# Patient Record
Sex: Female | Born: 1990 | Race: Asian | Hispanic: No | Marital: Married | Smoking: Never smoker
Health system: Southern US, Community
[De-identification: ages and names within clinical notes are randomized; demographics above are authoritative.]

## PROBLEM LIST (undated history)

## (undated) DIAGNOSIS — Z789 Other specified health status: Secondary | ICD-10-CM

## (undated) HISTORY — PX: NO PAST SURGERIES: SHX2092

---

## 2013-11-20 NOTE — L&D Delivery Note (Signed)
Delivery Note At 9:16 AM a viable female was delivered via Vaginal, Spontaneous Delivery (Presentation: Left Occiput Anterior). Loose nuchal cord reduced over body. Trailing membranes. Uterine exploration x1 for small fragment. Hemostatic.  APGAR: 8, 9; weight .   Placenta status: Intact, Spontaneous.  Cord: 3 vessels with the following complications: None.    Anesthesia: Epidural  Episiotomy: None Lacerations: 2nd degree Suture Repair: 3.0 vicryl Est. Blood Loss (mL): 300  Mom to postpartum.  Baby to Couplet care / Skin to Skin.  Bena Kobel 05/31/2014, 10:02 AM

## 2013-12-01 ENCOUNTER — Other Ambulatory Visit (HOSPITAL_COMMUNITY): Payer: Self-pay | Admitting: Physician Assistant

## 2013-12-01 DIAGNOSIS — Z3689 Encounter for other specified antenatal screening: Secondary | ICD-10-CM

## 2013-12-01 LAB — OB RESULTS CONSOLE HIV ANTIBODY (ROUTINE TESTING): HIV: NONREACTIVE

## 2013-12-01 LAB — OB RESULTS CONSOLE GC/CHLAMYDIA
Chlamydia: NEGATIVE
Gonorrhea: NEGATIVE

## 2013-12-01 LAB — OB RESULTS CONSOLE RPR: RPR: NONREACTIVE

## 2013-12-01 LAB — OB RESULTS CONSOLE ABO/RH: RH Type: POSITIVE

## 2013-12-01 LAB — OB RESULTS CONSOLE RUBELLA ANTIBODY, IGM: Rubella: IMMUNE

## 2013-12-01 LAB — OB RESULTS CONSOLE HEPATITIS B SURFACE ANTIGEN: Hepatitis B Surface Ag: NEGATIVE

## 2013-12-01 LAB — OB RESULTS CONSOLE ANTIBODY SCREEN: Antibody Screen: NEGATIVE

## 2013-12-02 ENCOUNTER — Other Ambulatory Visit (HOSPITAL_COMMUNITY): Payer: Self-pay | Admitting: Physician Assistant

## 2013-12-02 DIAGNOSIS — Z3689 Encounter for other specified antenatal screening: Secondary | ICD-10-CM

## 2013-12-05 ENCOUNTER — Ambulatory Visit (HOSPITAL_COMMUNITY)
Admission: RE | Admit: 2013-12-05 | Discharge: 2013-12-05 | Disposition: A | Discharge status: Home / Self Care | Payer: Medicaid Other | Source: Ambulatory Visit | Attending: Physician Assistant | Admitting: Physician Assistant

## 2013-12-05 DIAGNOSIS — O26879 Cervical shortening, unspecified trimester: Secondary | ICD-10-CM | POA: Insufficient documentation

## 2013-12-05 DIAGNOSIS — Z3689 Encounter for other specified antenatal screening: Secondary | ICD-10-CM

## 2014-01-05 ENCOUNTER — Ambulatory Visit (HOSPITAL_COMMUNITY)
Admission: RE | Admit: 2014-01-05 | Discharge: 2014-01-05 | Disposition: A | Discharge status: Home / Self Care | Payer: Medicaid Other | Source: Ambulatory Visit | Attending: Physician Assistant | Admitting: Physician Assistant

## 2014-01-05 DIAGNOSIS — Z3689 Encounter for other specified antenatal screening: Secondary | ICD-10-CM | POA: Insufficient documentation

## 2014-01-06 ENCOUNTER — Other Ambulatory Visit (HOSPITAL_COMMUNITY): Payer: Self-pay | Admitting: Physician Assistant

## 2014-01-06 DIAGNOSIS — N883 Incompetence of cervix uteri: Secondary | ICD-10-CM

## 2014-01-19 ENCOUNTER — Ambulatory Visit (HOSPITAL_COMMUNITY)
Admission: RE | Admit: 2014-01-19 | Discharge: 2014-01-19 | Disposition: A | Discharge status: Home / Self Care | Payer: Medicaid Other | Source: Ambulatory Visit | Attending: Physician Assistant | Admitting: Physician Assistant

## 2014-01-19 DIAGNOSIS — N883 Incompetence of cervix uteri: Secondary | ICD-10-CM

## 2014-01-19 DIAGNOSIS — O26879 Cervical shortening, unspecified trimester: Secondary | ICD-10-CM | POA: Insufficient documentation

## 2014-04-14 ENCOUNTER — Other Ambulatory Visit (HOSPITAL_COMMUNITY): Payer: Self-pay | Admitting: Nurse Practitioner

## 2014-04-14 DIAGNOSIS — Z0374 Encounter for suspected problem with fetal growth ruled out: Secondary | ICD-10-CM

## 2014-04-23 ENCOUNTER — Ambulatory Visit (HOSPITAL_COMMUNITY)
Admission: RE | Admit: 2014-04-23 | Discharge: 2014-04-23 | Disposition: A | Discharge status: Home / Self Care | Payer: Medicaid Other | Source: Ambulatory Visit | Attending: Nurse Practitioner | Admitting: Nurse Practitioner

## 2014-04-23 DIAGNOSIS — Z0374 Encounter for suspected problem with fetal growth ruled out: Secondary | ICD-10-CM

## 2014-04-23 DIAGNOSIS — Z3689 Encounter for other specified antenatal screening: Secondary | ICD-10-CM | POA: Insufficient documentation

## 2014-05-04 LAB — OB RESULTS CONSOLE GBS: GBS: NEGATIVE

## 2014-05-31 ENCOUNTER — Inpatient Hospital Stay (HOSPITAL_COMMUNITY)
Admission: AD | Admit: 2014-05-31 | Discharge: 2014-06-01 | DRG: 775 | Disposition: A | Discharge status: Home / Self Care | Payer: Medicaid Other | Source: Ambulatory Visit | Attending: Obstetrics & Gynecology | Admitting: Obstetrics & Gynecology

## 2014-05-31 ENCOUNTER — Inpatient Hospital Stay (HOSPITAL_COMMUNITY): Payer: Medicaid Other | Admitting: Anesthesiology

## 2014-05-31 ENCOUNTER — Encounter (HOSPITAL_COMMUNITY): Payer: Medicaid Other | Admitting: Anesthesiology

## 2014-05-31 ENCOUNTER — Encounter (HOSPITAL_COMMUNITY): Payer: Self-pay | Admitting: *Deleted

## 2014-05-31 DIAGNOSIS — O479 False labor, unspecified: Secondary | ICD-10-CM | POA: Diagnosis present

## 2014-05-31 DIAGNOSIS — IMO0001 Reserved for inherently not codable concepts without codable children: Secondary | ICD-10-CM

## 2014-05-31 HISTORY — DX: Other specified health status: Z78.9

## 2014-05-31 LAB — CBC
HCT: 34.5 % — ABNORMAL LOW (ref 36.0–46.0)
HEMOGLOBIN: 11.1 g/dL — AB (ref 12.0–15.0)
MCH: 23.3 pg — AB (ref 26.0–34.0)
MCHC: 32.2 g/dL (ref 30.0–36.0)
MCV: 72.3 fL — AB (ref 78.0–100.0)
PLATELETS: 322 10*3/uL (ref 150–400)
RBC: 4.77 MIL/uL (ref 3.87–5.11)
RDW: 14.3 % (ref 11.5–15.5)
WBC: 12.4 10*3/uL — ABNORMAL HIGH (ref 4.0–10.5)

## 2014-05-31 LAB — TYPE AND SCREEN
ABO/RH(D): A POS
ANTIBODY SCREEN: NEGATIVE

## 2014-05-31 LAB — ABO/RH: ABO/RH(D): A POS

## 2014-05-31 LAB — RPR

## 2014-05-31 MED ORDER — OXYCODONE-ACETAMINOPHEN 5-325 MG PO TABS
1.0000 | ORAL_TABLET | ORAL | Status: DC | PRN
  Administered 2014-06-01: 1 via ORAL
  Filled 2014-05-31: qty 1

## 2014-05-31 MED ORDER — LANOLIN HYDROUS EX OINT: TOPICAL_OINTMENT | CUTANEOUS | Status: DC | PRN

## 2014-05-31 MED ORDER — LACTATED RINGERS IV SOLN
500.0000 mL | INTRAVENOUS | Status: DC | PRN
  Administered 2014-05-31: 500 mL via INTRAVENOUS

## 2014-05-31 MED ORDER — WITCH HAZEL-GLYCERIN EX PADS: 1.0000 "application " | MEDICATED_PAD | CUTANEOUS | Status: DC | PRN

## 2014-05-31 MED ORDER — PHENYLEPHRINE 40 MCG/ML (10ML) SYRINGE FOR IV PUSH (FOR BLOOD PRESSURE SUPPORT)
80.0000 ug | PREFILLED_SYRINGE | INTRAVENOUS | Status: DC | PRN
  Filled 2014-05-31: qty 2
  Filled 2014-05-31: qty 10

## 2014-05-31 MED ORDER — ONDANSETRON HCL 4 MG/2ML IJ SOLN: 4.0000 mg | INTRAMUSCULAR | Status: DC | PRN

## 2014-05-31 MED ORDER — ONDANSETRON HCL 4 MG PO TABS: 4.0000 mg | ORAL_TABLET | ORAL | Status: DC | PRN

## 2014-05-31 MED ORDER — DIPHENHYDRAMINE HCL 25 MG PO CAPS: 25.0000 mg | ORAL_CAPSULE | Freq: Four times a day (QID) | ORAL | Status: DC | PRN

## 2014-05-31 MED ORDER — IBUPROFEN 600 MG PO TABS
600.0000 mg | ORAL_TABLET | Freq: Four times a day (QID) | ORAL | Status: DC
  Administered 2014-05-31 – 2014-06-01 (×3): 600 mg via ORAL
  Filled 2014-05-31 (×3): qty 1

## 2014-05-31 MED ORDER — LACTATED RINGERS IV SOLN
500.0000 mL | Freq: Once | INTRAVENOUS | Status: AC
  Administered 2014-05-31: 500 mL via INTRAVENOUS

## 2014-05-31 MED ORDER — SENNOSIDES-DOCUSATE SODIUM 8.6-50 MG PO TABS
2.0000 | ORAL_TABLET | ORAL | Status: DC
  Administered 2014-05-31: 2 via ORAL
  Filled 2014-05-31: qty 2

## 2014-05-31 MED ORDER — ONDANSETRON HCL 4 MG/2ML IJ SOLN: 4.0000 mg | Freq: Four times a day (QID) | INTRAMUSCULAR | Status: DC | PRN

## 2014-05-31 MED ORDER — TETANUS-DIPHTH-ACELL PERTUSSIS 5-2.5-18.5 LF-MCG/0.5 IM SUSP: 0.5000 mL | Freq: Once | INTRAMUSCULAR | Status: DC

## 2014-05-31 MED ORDER — FENTANYL 2.5 MCG/ML BUPIVACAINE 1/10 % EPIDURAL INFUSION (WH - ANES): 12.0000 mL/h | INTRAMUSCULAR | Status: DC | PRN

## 2014-05-31 MED ORDER — BENZOCAINE-MENTHOL 20-0.5 % EX AERO: 1.0000 "application " | INHALATION_SPRAY | CUTANEOUS | Status: DC | PRN

## 2014-05-31 MED ORDER — OXYTOCIN BOLUS FROM INFUSION
500.0000 mL | INTRAVENOUS | Status: DC
  Administered 2014-05-31: 500 mL via INTRAVENOUS

## 2014-05-31 MED ORDER — ACETAMINOPHEN 325 MG PO TABS: 650.0000 mg | ORAL_TABLET | ORAL | Status: DC | PRN

## 2014-05-31 MED ORDER — PRENATAL MULTIVITAMIN CH
1.0000 | ORAL_TABLET | Freq: Every day | ORAL | Status: DC
  Administered 2014-06-01: 1 via ORAL
  Filled 2014-05-31: qty 1

## 2014-05-31 MED ORDER — DIBUCAINE 1 % RE OINT: 1.0000 "application " | TOPICAL_OINTMENT | RECTAL | Status: DC | PRN

## 2014-05-31 MED ORDER — SIMETHICONE 80 MG PO CHEW: 80.0000 mg | CHEWABLE_TABLET | ORAL | Status: DC | PRN

## 2014-05-31 MED ORDER — LIDOCAINE HCL (PF) 1 % IJ SOLN
30.0000 mL | INTRAMUSCULAR | Status: DC | PRN
  Filled 2014-05-31: qty 30

## 2014-05-31 MED ORDER — ZOLPIDEM TARTRATE 5 MG PO TABS: 5.0000 mg | ORAL_TABLET | Freq: Every evening | ORAL | Status: DC | PRN

## 2014-05-31 MED ORDER — FENTANYL 2.5 MCG/ML BUPIVACAINE 1/10 % EPIDURAL INFUSION (WH - ANES)
INTRAMUSCULAR | Status: DC | PRN
  Administered 2014-05-31: 12 mL/h via EPIDURAL

## 2014-05-31 MED ORDER — EPHEDRINE 5 MG/ML INJ
10.0000 mg | INTRAVENOUS | Status: DC | PRN
  Filled 2014-05-31: qty 2

## 2014-05-31 MED ORDER — OXYTOCIN 40 UNITS IN LACTATED RINGERS INFUSION - SIMPLE MED
62.5000 mL/h | INTRAVENOUS | Status: DC
  Filled 2014-05-31: qty 1000

## 2014-05-31 MED ORDER — IBUPROFEN 600 MG PO TABS
600.0000 mg | ORAL_TABLET | Freq: Four times a day (QID) | ORAL | Status: DC | PRN
  Administered 2014-05-31: 600 mg via ORAL
  Filled 2014-05-31: qty 1

## 2014-05-31 MED ORDER — LACTATED RINGERS IV SOLN
INTRAVENOUS | Status: DC
  Administered 2014-05-31: 06:00:00 via INTRAVENOUS
  Administered 2014-05-31: 125 mL/h via INTRAVENOUS

## 2014-05-31 MED ORDER — FENTANYL 2.5 MCG/ML BUPIVACAINE 1/10 % EPIDURAL INFUSION (WH - ANES)
14.0000 mL/h | INTRAMUSCULAR | Status: DC | PRN
  Filled 2014-05-31: qty 125

## 2014-05-31 MED ORDER — PHENYLEPHRINE 40 MCG/ML (10ML) SYRINGE FOR IV PUSH (FOR BLOOD PRESSURE SUPPORT)
80.0000 ug | PREFILLED_SYRINGE | INTRAVENOUS | Status: DC | PRN
  Filled 2014-05-31: qty 2

## 2014-05-31 MED ORDER — CITRIC ACID-SODIUM CITRATE 334-500 MG/5ML PO SOLN: 30.0000 mL | ORAL | Status: DC | PRN

## 2014-05-31 MED ORDER — DIPHENHYDRAMINE HCL 50 MG/ML IJ SOLN: 12.5000 mg | INTRAMUSCULAR | Status: DC | PRN

## 2014-05-31 MED ORDER — OXYCODONE-ACETAMINOPHEN 5-325 MG PO TABS: 1.0000 | ORAL_TABLET | ORAL | Status: DC | PRN

## 2014-05-31 MED ORDER — LIDOCAINE HCL (PF) 1 % IJ SOLN
INTRAMUSCULAR | Status: DC | PRN
  Administered 2014-05-31 (×2): 4 mL

## 2014-05-31 NOTE — Progress Notes (Signed)
IV bolus of 500 cc started

## 2014-05-31 NOTE — H&P (Signed)
Adriana Keith is a 23 y.o. female G2P1001 with IUP at 2017w1d presenting for painful ctx beginning at 1230 this monring. They have increased in frequeny and intensity. Pt states she lost her mucous plug and the ctx started. No LOF, no Vb, normal fetal movement.Marland Kitchen.   [facility] at [facility] since 2nd trimester Prenatal History/Complications: Hgb E trait   Past Medical History: Past Medical History  Diagnosis Date  . Medical history non-contributory     Past Surgical History: Past Surgical History  Procedure Laterality Date  . No past surgeries      Obstetrical History: OB History   Grav Para Term Preterm Abortions TAB SAB Ect Mult Living   2 1 1       1       Gynecological History: OB History   Grav Para Term Preterm Abortions TAB SAB Ect Mult Living   2 1 1       1       Social History: History   Social History  . Marital Status: Married    Spouse Name: N/A    Number of Children: N/A  . Years of Education: N/A   Social History Main Topics  . Smoking status: Never Smoker   . Smokeless tobacco: None  . Alcohol Use: No  . Drug Use: No  . Sexual Activity: Yes   Other Topics Concern  . None   Social History Narrative  . None    Family History: No family history on file.  Allergies: No Known Allergies  Prescriptions prior to admission  Medication Sig Dispense Refill  . Prenatal Vit-Fe Fumarate-FA (PRENATAL MULTIVITAMIN) TABS tablet Take 1 tablet by mouth daily at 12 noon.         Review of Systems   Constitutional: no complaints  Blood pressure 115/82, pulse 80, temperature 98 F (36.7 C), temperature source Oral, resp. rate 18, height 5' (1.524 m), weight 59.966 kg (132 lb 3.2 oz). General appearance: alert, cooperative, appears stated age and no distress Lungs: clear to auscultation bilaterally Heart: regular rate and rhythm Abdomen: soft, non-tender; bowel sounds normal Pelvic: adequate Extremities: Homans sign is negative, no sign of DVT DTR's  2+ Presentation: cephalic Fetal monitoringBaseline: 130s bpm, Variability: Good {> 6 bpm), Accelerations: Reactive and Decelerations:  1 early decel  Uterine activity q232m Dilation: 6 Effacement (%): 90 Station: -1 Exam by:: D Simpson RN   Prenatal labs: ABO, Rh: A/Positive/-- (01/12 0000) Antibody: Negative (01/12 0000) Rubella:  Immune RPR: Nonreactive (01/12 0000)  HBsAg: Negative (01/12 0000)  HIV: Non-reactive (01/12 0000)  GBS: Negative (06/15 0000)  1 hr Glucola 86 Genetic screening  Neg Anatomy US Neg   Prenatal Transfer Tool  Maternal Diabetes: No Genetic Screening: Normal Maternal Ultrasounds/Referrals: Normal Fetal Ultrasounds or other Referrals:  None Maternal Substance Abuse:  No Significant Maternal Medications:  None Significant Maternal Lab Results: Lab values include: Group B Strep negative, hgb E trait in mother     No results found for this or any previous visit (from the past 24 hour(s)).  Assessment: Rick Heikkila is a 23 y.o. G2P1001 at 6117w1d by L=14 here for SOOL #Labor: pt making change wihtout augmentation at this time. Monitor and augment PRN #Pain: Desires epdirual #FWB: Cat I #ID:  GBS Neg no PPX required #MOF: Breast/Bottle #Circ:  As Outpatient #Hgb E Trait: Nothing to do. Testing as fetus  Tawana ScaleODOM, Mirza Fessel RYAN 05/31/2014, 4:18 AM

## 2014-05-31 NOTE — Progress Notes (Signed)
Brinda Medico is a 23 y.o. G2P1001 at 7131w1d admitted for active labor  Subjective: Pt comfortable with epidural. No complaints  Objective: BP 104/74  Pulse 77  Temp(Src) 97.6 F (36.4 C) (Oral)  Resp 18  Ht 5' (1.524 m)  Wt 59.966 kg (132 lb 3.2 oz)  BMI 25.82 kg/m2  SpO2 99%      FHT:  FHR: 140s bpm, variability: moderate,  accelerations:  Present,  decelerations:  Absent UC:   irregular, every 2-4 minutes SVE:   Dilation: 7.5 Effacement (%): 90 Station: 0 Exam by:: C.Byers, RN   Labs: Lab Results  Component Value Date   WBC 12.4* 05/31/2014   HGB 11.1* 05/31/2014   HCT 34.5* 05/31/2014   MCV 72.3* 05/31/2014   PLT 322 05/31/2014    Assessment / Plan: Spontaneous labor, progressing normally  Labor: Progressing normally Preeclampsia:  no signs or symptoms of toxicity Fetal Wellbeing:  Category I Pain Control:  Epidural I/D:  n/a Anticipated MOD:  NSVD  Javonte Elenes RYAN 05/31/2014, 7:06 AM

## 2014-05-31 NOTE — MAU Note (Addendum)
Pt stated she has been having ctx since 1230am. Reports loosing mucus plug but no leaking or bleeding. Good fetal movement reported. 3cm last cervical check

## 2014-05-31 NOTE — Progress Notes (Signed)
Pt taken to bathroom per steady. Pt unable to void.  Pt became dizzy and fainted on toliet.  Pt assisted back to bed with steady and additional RNs.  HOB lowered, vitals taken and juice given.

## 2014-05-31 NOTE — Lactation Note (Signed)
This note was copied from the chart of Adriana Oleda Hamza. Lactation Consultation Note initial visit at 11 hours of age.  Mom reports breastfeeding is going well.  Mom speaks Rade per chart, but FOB translates for her and mom says she understands some english.  Mom did not breast feed with her now almost 23 year old.  Mom has erect nipples with colostrum easily flowing. Encouraged to rub into nipples EBM.  Attempted latch in cross cradle and football hold.  MOm reports trying cradle hold and has a bruise on right nipple.   Baby is too sleepy, colostrum expressed to baby's mouth.  [facility] given and discussed.  Encouraged to feed with early cues on demand.  Early newborn behavior discussed.  Mom to call for assist as needed.     Patient Name: Adriana Keith Today's Date: 05/31/2014 Reason for consult: Initial assessment   Maternal Data Has patient been taught Hand Expression?: Yes Does the patient have breastfeeding experience prior to this delivery?: No  Feeding Feeding Type: Breast Fed  LATCH Score/Interventions Latch: Too sleepy or reluctant, no latch achieved, no sucking elicited.  Audible Swallowing: None  Type of Nipple: Everted at rest and after stimulation  Comfort (Breast/Nipple): Soft / non-tender     Hold (Positioning): Assistance needed to correctly position infant at breast and maintain latch. Intervention(s): Breastfeeding basics reviewed;Support Pillows;Position options;Skin to skin  LATCH Score: 5  Lactation Tools Discussed/Used     Consult Status Consult Status: Follow-up Date: 06/01/14 Follow-up type: In-patient    Beverely RisenShoptaw, Arvella MerlesJana Lynn 05/31/2014, 8:38 PM

## 2014-05-31 NOTE — Anesthesia Preprocedure Evaluation (Signed)
Anesthesia Evaluation  Patient identified by MRN, date of birth, ID band Patient awake    Reviewed: Allergy & Precautions, H&P , NPO status , Patient's Chart, lab work & pertinent test results  Airway Mallampati: II TM Distance: >3 FB Neck ROM: Full    Dental   Pulmonary neg pulmonary ROS,  breath sounds clear to auscultation        Cardiovascular negative cardio ROS  Rhythm:Regular Rate:Normal     Neuro/Psych negative neurological ROS  negative psych ROS   GI/Hepatic negative GI ROS, Neg liver ROS,   Endo/Other  negative endocrine ROS  Renal/GU negative Renal ROS     Musculoskeletal negative musculoskeletal ROS (+)   Abdominal   Peds  Hematology negative hematology ROS (+)   Anesthesia Other Findings   Reproductive/Obstetrics negative OB ROS                           Anesthesia Physical Anesthesia Plan  ASA: II  Anesthesia Plan: Epidural   Post-op Pain Management:    Induction:   Airway Management Planned:   Additional Equipment:   Intra-op Plan:   Post-operative Plan:   Informed Consent: I have reviewed the patients History and Physical, chart, labs and discussed the procedure including the risks, benefits and alternatives for the proposed anesthesia with the patient or authorized representative who has indicated his/her understanding and acceptance.     Plan Discussed with:   Anesthesia Plan Comments:         Anesthesia Quick Evaluation

## 2014-05-31 NOTE — Anesthesia Procedure Notes (Signed)
Epidural Patient location during procedure: OB  Staffing Anesthesiologist: Pearley Baranek R Performed by: anesthesiologist   Preanesthetic Checklist Completed: patient identified, pre-op evaluation, timeout performed, IV checked, risks and benefits discussed and monitors and equipment checked  Epidural Patient position: sitting Prep: site prepped and draped and DuraPrep Patient monitoring: heart rate Approach: midline Location: L3-L4 Injection technique: LOR air and LOR saline  Needle:  Needle type: Tuohy  Needle gauge: 17 G Needle length: 9 cm Needle insertion depth: 4 cm Catheter type: closed end flexible Catheter size: 19 Gauge Catheter at skin depth: 10 cm Test dose: negative  Assessment Sensory level: T8 Events: blood not aspirated, injection not painful, no injection resistance, negative IV test and no paresthesia  Additional Notes Reason for block:procedure for pain   

## 2014-05-31 NOTE — MAU Note (Signed)
Report called to Dana RN in BS.  

## 2014-05-31 NOTE — H&P (Signed)
`````  Attestation of Attending Supervision of Advanced Practitioner: Evaluation and management procedures were performed by the PA/NP/CNM/OB Fellow under my supervision/collaboration. Chart reviewed and agree with management and plan.  Aldrick Derrig V 05/31/2014 9:44 PM

## 2014-06-01 MED ORDER — IBUPROFEN 600 MG PO TABS: 600.0000 mg | ORAL_TABLET | Freq: Four times a day (QID) | ORAL | Status: DC

## 2014-06-01 NOTE — Progress Notes (Signed)
Ur chart review completed.  

## 2014-06-01 NOTE — Discharge Summary (Signed)
Obstetric Discharge Summary Reason for Admission: onset of labor Prenatal Procedures: none Intrapartum Procedures: spontaneous vaginal delivery Postpartum Procedures: none Complications-Operative and Postpartum: none Hemoglobin  Date Value Ref Range Status  05/31/2014 11.1* 12.0 - 15.0 g/dL Final     HCT  Date Value Ref Range Status  05/31/2014 34.5* 36.0 - 46.0 % Final    Physical Exam:  General: alert and cooperative Lochia: appropriate Uterine Fundus: firm Incision: n/a DVT Evaluation: No evidence of DVT seen on physical exam. No cords or calf tenderness. No significant calf/ankle edema.  Discharge Diagnoses: Term Pregnancy-delivered Adriana Keith is a 23 y.o. G2P1001 at 3817w1d by L=14 here for SOOL. Progressed without augmentation. Delivered viable baby boy via SVD. Complicated only by loose nuchal, reduced over entire body. @nd  degree lac noted, repaired with 3.0. EBL approx 300. 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 Mother and baby did well postpartum, no complaints. Undecided re-contraception. Plans to cont to breast/bottle feed. Does not desire circ. Will f/up at HD in 4-6 weeks.    Discharge Information: Date: 06/01/2014 Activity: unrestricted Diet: routine Medications: Ibuprofen Condition: stable Instructions: refer to practice specific booklet Discharge to: home   Newborn Data: Live born female  Birth Weight: 6 lb 14.4 oz (3130 g) APGAR: 8, 9  Home with mother.  Anselm LisMarsh, Melanie 06/01/2014, 7:22 AM  I examined pt and agree with documentation above and resident plan of care. Eino FarberWalidah Paul HalfN Muhammad, CNM

## 2014-06-01 NOTE — Anesthesia Postprocedure Evaluation (Signed)
Anesthesia Post Note  Patient: Adriana Keith  Procedure(s) Performed: * No procedures listed *  Anesthesia type: Epidural  Patient location: Mother/Baby  Post pain: Pain level controlled  Post assessment: Post-op Vital signs reviewed  Last Vitals:  Filed Vitals:   06/01/14 0330  BP: 94/54  Pulse: 87  Temp: 36.9 C  Resp: 18    Post vital signs: Reviewed  Level of consciousness:alert  Complications: No apparent anesthesia complications

## 2014-06-01 NOTE — Discharge Instructions (Signed)
Vaginal Delivery °Care After °Refer to this sheet in the next few weeks. These discharge instructions provide you with information on caring for yourself after delivery. Your caregiver may also give you specific instructions. Your treatment has been planned according to the most current medical practices available, but problems sometimes occur. Call your caregiver if you have any problems or questions after you go home. °HOME CARE INSTRUCTIONS °· Take over-the-counter or prescription medicines only as directed by your caregiver or pharmacist. °· Do not drink alcohol, especially if you are breastfeeding or taking medicine to relieve pain. °· Do not chew or smoke tobacco. °· Do not use illegal drugs. °· Continue to use good perineal care. Good perineal care includes: °¨ Wiping your perineum from front to back. °¨ Keeping your perineum clean. °· Do not use tampons or douche until your caregiver says it is okay. °· Shower, wash your hair, and take tub baths as directed by your caregiver. °· Wear a well-fitting bra that provides breast support. °· Eat healthy foods. °· Drink enough fluids to keep your urine clear or pale yellow. °· Eat high-fiber foods such as whole grain cereals and breads, brown rice, beans, and fresh fruits and vegetables every day. These foods may help prevent or relieve constipation. °· Follow your cargiver's recommendations regarding resumption of activities such as climbing stairs, driving, lifting, exercising, or traveling. °· Talk to your caregiver about resuming sexual activities. Resumption of sexual activities is dependent upon your risk of infection, your rate of healing, and your comfort and desire to resume sexual activity. °· Try to have someone help you with your household activities and your newborn for at least a few days after you leave the hospital. °· Rest as much as possible. Try to rest or take a nap when your newborn is sleeping. °· Increase your activities gradually. °· Keep all  of your scheduled postpartum appointments. It is very important to keep your scheduled follow-up appointments. At these appointments, your caregiver will be checking to make sure that you are healing physically and emotionally. °SEEK MEDICAL CARE IF:  °· You are passing large clots from your vagina. Save any clots to show your caregiver. °· You have a foul smelling discharge from your vagina. °· You have trouble urinating. °· You are urinating frequently. °· You have pain when you urinate. °· You have a change in your bowel movements. °· You have increasing redness, pain, or swelling near your vaginal incision (episiotomy) or vaginal tear. °· You have pus draining from your episiotomy or vaginal tear. °· Your episiotomy or vaginal tear is separating. °· You have painful, hard, or reddened breasts. °· You have a severe headache. °· You have blurred vision or see spots. °· You feel sad or depressed. °· You have thoughts of hurting yourself or your newborn. °· You have questions about your care, the care of your newborn, or medicines. °· You are dizzy or lightheaded. °· You have a rash. °· You have nausea or vomiting. °· You were breastfeeding and have not had a menstrual period within 12 weeks after you stopped breastfeeding. °· You are not breastfeeding and have not had a menstrual period by the 12th week after delivery. °· You have a fever. °SEEK IMMEDIATE MEDICAL CARE IF:  °· You have persistent pain. °· You have chest pain. °· You have shortness of breath. °· You faint. °· You have leg pain. °· You have stomach pain. °· Your vaginal bleeding saturates two or more sanitary pads   in 1 hour. °MAKE SURE YOU:  °· Understand these instructions. °· Will watch your condition. °· Will get help right away if you are not doing well or get worse. ° ° °Document Released: 11/03/2000 Document Revised: 07/31/2012 Document Reviewed: 07/03/2012 °[employer]® Patient Information ©2015 [employer]. This information is not intended to  replace advice given to you by your health care provider. Make sure you discuss any questions you have with your health care provider. ° °

## 2014-09-21 ENCOUNTER — Encounter (HOSPITAL_COMMUNITY): Payer: Self-pay | Admitting: *Deleted

## 2016-11-02 LAB — OB RESULTS CONSOLE RUBELLA ANTIBODY, IGM: RUBELLA: IMMUNE

## 2016-11-02 LAB — OB RESULTS CONSOLE HEPATITIS B SURFACE ANTIGEN: Hepatitis B Surface Ag: NEGATIVE

## 2016-11-20 NOTE — L&D Delivery Note (Signed)
Patient is 26 yo G3 now P3 who presented early evening yesterday with contractions every 3-5 min and dilated to 4 cm and was discharged later that evening after no cervical change was noted on exam. Patient returned a few hours later with stronger contractions and dilated to 8 cm. Patient was quickly moved to L&D unit where she was noted to be complete. Patient quickly delivered shortly after arrival on L&D floor.  Delivery Note At 2:53 AM a viable female was delivered via Vaginal, Spontaneous Delivery (Presentation: ROA ).  APGAR: 8, 9; weight pending.   Placenta status: Delivered intact with gentle traction. Cord: 3 vessels with the following complications: none .  Cord pH: Not collected  Anesthesia:  None Episiotomy: None Lacerations: 1st degree;Perineal Suture Repair: did not require repair Est. Blood Loss (mL): 150  Mom to postpartum.  Baby to Couplet care / Skin to Skin.  Lovena Neighbours, PGY-1 03/08/2017, 3:19 AM  Patient is a [id number] at [redacted]w[redacted]d who was admitted in SOL/SROM in MAU, uncomplicated prenatal course.  She progressed without augmentation.  I was gloved and present for delivery in its entirety.  Second stage of labor progressed quickly to SVD.  Complications: none  Lacerations: 1st degree perineal, hemostatic  EBL: 150cc  Cam Hai, CNM 6:35 AM  03/08/2017

## 2016-12-21 LAB — OB RESULTS CONSOLE HIV ANTIBODY (ROUTINE TESTING): HIV: NONREACTIVE

## 2017-02-12 LAB — OB RESULTS CONSOLE RPR: RPR: NONREACTIVE

## 2017-02-12 LAB — OB RESULTS CONSOLE GBS: GBS: NEGATIVE

## 2017-02-12 LAB — OB RESULTS CONSOLE GC/CHLAMYDIA: GC PROBE AMP, GENITAL: NEGATIVE

## 2017-03-06 ENCOUNTER — Other Ambulatory Visit (HOSPITAL_COMMUNITY): Payer: Self-pay | Admitting: Nurse Practitioner

## 2017-03-06 DIAGNOSIS — Z3A39 39 weeks gestation of pregnancy: Secondary | ICD-10-CM

## 2017-03-06 DIAGNOSIS — O365930 Maternal care for other known or suspected poor fetal growth, third trimester, not applicable or unspecified: Secondary | ICD-10-CM

## 2017-03-07 ENCOUNTER — Ambulatory Visit (HOSPITAL_COMMUNITY)
Admission: RE | Admit: 2017-03-07 | Discharge: 2017-03-07 | Disposition: A | Discharge status: Home / Self Care | Payer: Medicaid Other | Source: Ambulatory Visit | Attending: Nurse Practitioner | Admitting: Nurse Practitioner

## 2017-03-07 ENCOUNTER — Other Ambulatory Visit (HOSPITAL_COMMUNITY): Payer: Self-pay | Admitting: Nurse Practitioner

## 2017-03-07 ENCOUNTER — Encounter (HOSPITAL_COMMUNITY): Payer: Self-pay

## 2017-03-07 ENCOUNTER — Inpatient Hospital Stay (HOSPITAL_COMMUNITY)
Admission: AD | Admit: 2017-03-07 | Discharge: 2017-03-07 | Disposition: A | Discharge status: Home / Self Care | Payer: Medicaid Other | Source: Ambulatory Visit | Attending: Obstetrics & Gynecology | Admitting: Obstetrics & Gynecology

## 2017-03-07 DIAGNOSIS — Z3A39 39 weeks gestation of pregnancy: Secondary | ICD-10-CM

## 2017-03-07 DIAGNOSIS — O471 False labor at or after 37 completed weeks of gestation: Secondary | ICD-10-CM

## 2017-03-07 DIAGNOSIS — O26843 Uterine size-date discrepancy, third trimester: Secondary | ICD-10-CM

## 2017-03-07 DIAGNOSIS — O365930 Maternal care for other known or suspected poor fetal growth, third trimester, not applicable or unspecified: Secondary | ICD-10-CM

## 2017-03-07 NOTE — MAU Note (Signed)
I have communicated with Denny Peon, CNM and reviewed vital signs:  Vitals:   03/07/17 2030 03/07/17 2142  BP:  108/69  Pulse: 98 90  Resp:  18  Temp:  98.1 F (36.7 C)    Vaginal exam:  Dilation: 4 Effacement (%): 70 Cervical Position: Middle Station: -2 Exam by:: Brenon Antosh, RN ,   Also reviewed contraction pattern and that non-stress test is reactive.  It has been documented that patient is contracting every 3-5 minutes with no cervical change over one hours not indicating active labor.  Patient denies any other complaints.  Based on this report provider has given order for discharge.  A discharge order and diagnosis entered by a provider.   Labor discharge instructions reviewed with patient.

## 2017-03-07 NOTE — MAU Note (Signed)
Pt. Here with c/o contractions that started this morning around 0300, contractions are now getting stronger. EFM applied - FHR 130s, Toco applied - contractions palpated.  Positive for fetal movement, "Yes, I'm feeling the baby move".  Denies sudden gush of fluid, denies vaginal bleeding. Last sexual encounter was "yesterday".

## 2017-03-07 NOTE — MAU Note (Signed)
Pt here with c/o contractions.  

## 2017-03-08 ENCOUNTER — Inpatient Hospital Stay (HOSPITAL_COMMUNITY)
Admission: AD | Admit: 2017-03-08 | Discharge: 2017-03-09 | DRG: 775 | Disposition: A | Discharge status: Home / Self Care | Payer: Medicaid Other | Source: Ambulatory Visit | Attending: Obstetrics & Gynecology | Admitting: Obstetrics & Gynecology

## 2017-03-08 ENCOUNTER — Encounter (HOSPITAL_COMMUNITY): Payer: Self-pay | Admitting: *Deleted

## 2017-03-08 DIAGNOSIS — Z3A39 39 weeks gestation of pregnancy: Secondary | ICD-10-CM

## 2017-03-08 DIAGNOSIS — Z3493 Encounter for supervision of normal pregnancy, unspecified, third trimester: Secondary | ICD-10-CM | POA: Diagnosis present

## 2017-03-08 LAB — TYPE AND SCREEN
ABO/RH(D): A POS
Antibody Screen: NEGATIVE

## 2017-03-08 LAB — CBC
HEMATOCRIT: 34.2 % — AB (ref 36.0–46.0)
Hemoglobin: 11.2 g/dL — ABNORMAL LOW (ref 12.0–15.0)
MCH: 23.7 pg — AB (ref 26.0–34.0)
MCHC: 32.7 g/dL (ref 30.0–36.0)
MCV: 72.3 fL — AB (ref 78.0–100.0)
Platelets: 341 10*3/uL (ref 150–400)
RBC: 4.73 MIL/uL (ref 3.87–5.11)
RDW: 14.4 % (ref 11.5–15.5)
WBC: 18.6 10*3/uL — ABNORMAL HIGH (ref 4.0–10.5)

## 2017-03-08 LAB — RPR: RPR Ser Ql: NONREACTIVE

## 2017-03-08 MED ORDER — LIDOCAINE HCL (PF) 1 % IJ SOLN
30.0000 mL | INTRAMUSCULAR | Status: DC | PRN
  Filled 2017-03-08: qty 30

## 2017-03-08 MED ORDER — OXYTOCIN BOLUS FROM INFUSION: 500.0000 mL | Freq: Once | INTRAVENOUS | Status: DC

## 2017-03-08 MED ORDER — TETANUS-DIPHTH-ACELL PERTUSSIS 5-2.5-18.5 LF-MCG/0.5 IM SUSP: 0.5000 mL | Freq: Once | INTRAMUSCULAR | Status: DC

## 2017-03-08 MED ORDER — ZOLPIDEM TARTRATE 5 MG PO TABS: 5.0000 mg | ORAL_TABLET | Freq: Every evening | ORAL | Status: DC | PRN

## 2017-03-08 MED ORDER — LACTATED RINGERS IV SOLN: 500.0000 mL | INTRAVENOUS | Status: DC | PRN

## 2017-03-08 MED ORDER — SOD CITRATE-CITRIC ACID 500-334 MG/5ML PO SOLN: 30.0000 mL | ORAL | Status: DC | PRN

## 2017-03-08 MED ORDER — IBUPROFEN 600 MG PO TABS
600.0000 mg | ORAL_TABLET | Freq: Four times a day (QID) | ORAL | Status: DC
  Administered 2017-03-08 – 2017-03-09 (×6): 600 mg via ORAL
  Filled 2017-03-08 (×5): qty 1

## 2017-03-08 MED ORDER — ONDANSETRON HCL 4 MG/2ML IJ SOLN
4.0000 mg | INTRAMUSCULAR | Status: DC | PRN
Start: 2017-03-08 — End: 2017-03-09

## 2017-03-08 MED ORDER — ONDANSETRON HCL 4 MG PO TABS: 4.0000 mg | ORAL_TABLET | ORAL | Status: DC | PRN

## 2017-03-08 MED ORDER — ONDANSETRON HCL 4 MG/2ML IJ SOLN: 4.0000 mg | Freq: Four times a day (QID) | INTRAMUSCULAR | Status: DC | PRN

## 2017-03-08 MED ORDER — LIDOCAINE HCL (PF) 1 % IJ SOLN
INTRAMUSCULAR | Status: AC
  Filled 2017-03-08: qty 30

## 2017-03-08 MED ORDER — BENZOCAINE-MENTHOL 20-0.5 % EX AERO: 1.0000 "application " | INHALATION_SPRAY | CUTANEOUS | Status: DC | PRN

## 2017-03-08 MED ORDER — COCONUT OIL OIL
1.0000 | TOPICAL_OIL | Status: DC | PRN
Start: 2017-03-08 — End: 2017-03-09
  Administered 2017-03-08: 1 via TOPICAL
  Filled 2017-03-08: qty 120

## 2017-03-08 MED ORDER — OXYTOCIN 40 UNITS IN LACTATED RINGERS INFUSION - SIMPLE MED
2.5000 [IU]/h | INTRAVENOUS | Status: DC
Start: 2017-03-08 — End: 2017-03-08

## 2017-03-08 MED ORDER — OXYCODONE-ACETAMINOPHEN 5-325 MG PO TABS: 2.0000 | ORAL_TABLET | ORAL | Status: DC | PRN

## 2017-03-08 MED ORDER — PRENATAL MULTIVITAMIN CH
1.0000 | ORAL_TABLET | Freq: Every day | ORAL | Status: DC
  Administered 2017-03-08 – 2017-03-09 (×2): 1 via ORAL
  Filled 2017-03-08 (×2): qty 1

## 2017-03-08 MED ORDER — OXYTOCIN 10 UNIT/ML IJ SOLN
INTRAMUSCULAR | Status: AC
Start: 2017-03-08 — End: 2017-03-08
  Administered 2017-03-08: 10 [IU]
  Filled 2017-03-08: qty 1

## 2017-03-08 MED ORDER — LACTATED RINGERS IV SOLN: INTRAVENOUS | Status: DC

## 2017-03-08 MED ORDER — DIPHENHYDRAMINE HCL 25 MG PO CAPS: 25.0000 mg | ORAL_CAPSULE | Freq: Four times a day (QID) | ORAL | Status: DC | PRN

## 2017-03-08 MED ORDER — SIMETHICONE 80 MG PO CHEW: 80.0000 mg | CHEWABLE_TABLET | ORAL | Status: DC | PRN

## 2017-03-08 MED ORDER — ACETAMINOPHEN 325 MG PO TABS: 650.0000 mg | ORAL_TABLET | ORAL | Status: DC | PRN

## 2017-03-08 MED ORDER — OXYCODONE-ACETAMINOPHEN 5-325 MG PO TABS: 1.0000 | ORAL_TABLET | ORAL | Status: DC | PRN

## 2017-03-08 MED ORDER — WITCH HAZEL-GLYCERIN EX PADS: 1.0000 "application " | MEDICATED_PAD | CUTANEOUS | Status: DC | PRN

## 2017-03-08 MED ORDER — DIBUCAINE 1 % RE OINT: 1.0000 "application " | TOPICAL_OINTMENT | RECTAL | Status: DC | PRN

## 2017-03-08 MED ORDER — SENNOSIDES-DOCUSATE SODIUM 8.6-50 MG PO TABS
2.0000 | ORAL_TABLET | ORAL | Status: DC
  Administered 2017-03-08: 2 via ORAL
  Filled 2017-03-08: qty 2

## 2017-03-08 NOTE — MAU Note (Signed)
PT  SAYS SHE WAS HERE EARLIER -  VE  4 CM  - HAS RETURNED  WITH UC'S  STRONGER.

## 2017-03-08 NOTE — Lactation Note (Signed)
This note was copied from a baby's chart. Lactation Consultation Note  Patient Name: Adriana Keith Today's Date: 03/08/2017 Reason for consult: Initial assessment Breastfeeding consultation services and support information given and reviewed.  This is mom's third baby and she has both breast and formula fed babies.  Discussed colostrum and milk coming to volume.  Mom knows to put baby to breast before giving formula.  No formula has been given thus far.  Encouraged to call for assist/concerns prn.  Maternal Data    Feeding    LATCH Score/Interventions                      Lactation Tools Discussed/Used     Consult Status Consult Status: Follow-up Date: 03/09/17 Follow-up type: In-patient    Huston Foley 03/08/2017, 11:20 AM

## 2017-03-08 NOTE — MAU Note (Signed)
Pt here with continued contractions after being discharged earlier tonight.

## 2017-03-08 NOTE — H&P (Addendum)
LABOR AND DELIVERY ADMISSION HISTORY AND PHYSICAL NOTE  Adriana Keith is a 26 y.o. female G108P2002 with IUP at [redacted]w[redacted]d by LMP  presenting for SOL. Patient had an uncomplicated prenatal care.  Patient presented to MAU with contractions every 3-5 min and dilated to 4cm earlier yesterday evening. Patient presented a few hours later at 8 cm with stronger contractions. Patient was moved to LD floor where she quickly delivered a healthy female infant.    She reports positive fetal movement. She denies leakage of fluid or vaginal bleeding.  Prenatal History/Complications:  Past Medical History: Past Medical History:  Diagnosis Date  . Medical history non-contributory     Past Surgical History: Past Surgical History:  Procedure Laterality Date  . NO PAST SURGERIES      Obstetrical History: OB History    Gravida Para Term Preterm AB Living   SAB TAB Ectopic Multiple Live Births           2      Social History: Social History   Social History  . Marital status: Married    Spouse name: N/A  . Number of children: N/A  . Years of education: N/A   Social History Main Topics  . Smoking status: Never Smoker  . Smokeless tobacco: Never Used  . Alcohol use No  . Drug use: No  . Sexual activity: Yes   Other Topics Concern  . None   Social History Narrative  . None    Family History: History reviewed. No pertinent family history.  Allergies: No Known Allergies  Prescriptions Prior to Admission  Medication Sig Dispense Refill Last Dose  . Prenatal Vit-Fe Fumarate-FA (PRENATAL MULTIVITAMIN) TABS tablet Take 1 tablet by mouth daily at 12 noon.   Past Week at Unknown time     Review of Systems   All systems reviewed and negative except as stated in HPI  Blood pressure 117/68, pulse (!) 107, temperature 98 F (36.7 C), resp. rate 20, height 5' (1.524 m), weight 134 lb (60.8 kg), SpO2 97 %, unknown if currently breastfeeding. General appearance: alert, cooperative  and appears stated age Lungs: normal respiratory effort, no audible wheezes. Heart: regular rate and pulses palpated distally bilaterally Abdomen: soft, non-tender; gravid abdomen appropriate for gestational age Extremities: No calf swelling or tenderness Presentation: cephalic by nurse check in the MAU Fetal monitoring: N/A Uterine activity: Regular Dilation: 10 Effacement (%): 100 Station: 0 Exam by:: Charlene Brooke RNC   Prenatal labs: ABO, Rh:  A positive Antibody:  Negative Rubella: Immune  RPR:  Negative  HBsAg:  Negative   HIV:   Non reactive  GBS:  Negative 1 hr Glucola: 89 Genetic screening: too late  Anatomy US: Normal   Prenatal Transfer Tool  Maternal Diabetes: No Genetic Screening: Abnormal:  Results: Other: N/A Maternal Ultrasounds/Referrals: Normal Fetal Ultrasounds or other Referrals:  None Maternal Substance Abuse:  No Significant Maternal Medications:  None Significant Maternal Lab Results: None  No results found for this or any previous visit (from the past 24 hour(s)).  Patient Active Problem List   Diagnosis Date Noted  . Normal labor 03/08/2017  . Active labor 05/31/2014  . NSVD (normal spontaneous vaginal delivery) 05/31/2014    Assessment: Adriana Keith is a 26 y.o. G3P2002 at [redacted]w[redacted]d here for SOL. Patient with uncomplicated prenatal care. Patient had precipitous phase 2 of labor. Patient did not received any pain medications prior to delivery.  #Labor: expectant management,  rapid phase 2 of labor #Pain: No IV pain meds or epidural given precipitous delivery on admission #FWB: N/A #ID:  GBS neg #MOF: Breast and Bottle #MOC: IUD #Circ:  Outpatient  Abdoulaye Diallo, PGY-1 03/08/2017, 3:28 AM   CNM attestation:  I have seen and examined this patient; I agree with above documentation in the resident's note.   Adriana Keith is a 26 y.o. G3P3003 here for SOL/SROM in MAU  PE: BP (!) 101/59 (BP Location: Left Arm)   Pulse (!) 108   Temp 97.8 F  (36.6 C) (Oral)   Resp 18   Ht 5' (1.524 m)   Wt 60.8 kg (134 lb)   SpO2 98%   Breastfeeding? Unknown   BMI 26.17 kg/m   Resp: normal effort, no distress Abd: gravid  ROS, labs, PMH reviewed  Plan: Admit to [facility] for imminent delivery  Cam Hai CNM 03/08/2017, 6:34 AM

## 2017-03-09 MED ORDER — IBUPROFEN 600 MG PO TABS: 600.0000 mg | ORAL_TABLET | Freq: Four times a day (QID) | ORAL | 0 refills | Status: AC

## 2017-03-09 NOTE — Discharge Summary (Signed)
       OB Discharge Summary  Patient Name: Adriana Keith DOB: [birth year] MRN: [id number]  Date of admission: 03/08/2017 Delivering MD: Lovena Neighbours   Date of discharge: 03/09/2017  Admitting diagnosis: 39 WEEKS CTX  Intrauterine pregnancy: [redacted]w[redacted]d     Secondary diagnosis:Active Problems:   Normal labor      Discharge diagnosis: Term Pregnancy Delivered                                                                      Complications: None  Hospital course:  Onset of Labor With Vaginal Delivery     26 y.o. yo G3P3003 at [redacted]w[redacted]d was admitted in Active Labor on 03/08/2017. Patient had an uncomplicated labor course as follows:  Membrane Rupture Time/Date: 2:50 AM ,03/08/2017   Intrapartum Procedures: Episiotomy: None [1]                                         Lacerations:  1st degree [2];Perineal [11]  Patient had a delivery of a Viable infant. 03/08/2017  Information for the patient's newborn:  Teauna, Dubach Magdelyn [[id number]]  Delivery Method: Vaginal, Spontaneous Delivery (Filed from Delivery Summary)    Pateint had an uncomplicated postpartum course.  She is ambulating, tolerating a regular diet, passing flatus, and urinating well. Patient is discharged home in stable condition on 03/09/17.   Physical exam  Vitals:   03/08/17 0555 03/08/17 0842 03/08/17 1637 03/09/17 0548  BP: (!) 101/59 (!) 100/56 110/64 (!) 103/55  Pulse: (!) 108 81 92 87  Resp: Temp: 97.8 F (36.6 C) 97.9 F (36.6 C) 98 F (36.7 C) 97.9 F (36.6 C)  TempSrc: Oral  Oral   SpO2: 98%  98%   Weight:      Height:       General: alert Lochia: appropriate Uterine Fundus: firm and NT at U-1 Incision: N/A DVT Evaluation: No evidence of DVT seen on physical exam. Labs: Lab Results  Component Value Date   WBC 18.6 (H) 03/08/2017   HGB 11.2 (L) 03/08/2017   HCT 34.2 (L) 03/08/2017   MCV 72.3 (L) 03/08/2017   PLT 341 03/08/2017   No flowsheet data found.  Discharge instruction: per After  Visit Summary and "Baby and Me Booklet".  After Visit Meds:  Allergies as of 03/09/2017   No Known Allergies     Medication List    TAKE these medications   ibuprofen 600 MG tablet Commonly known as:  ADVIL,MOTRIN Take 1 tablet (600 mg total) by mouth every 6 (six) hours.   prenatal multivitamin Tabs tablet Take 1 tablet by mouth daily at 12 noon.       Diet: routine diet  Activity: Advance as tolerated. Pelvic rest for 6 weeks.   Outpatient follow up:6 weeks Follow up Appt:No future appointments. Follow up visit: No Follow-up on file.  Postpartum contraception: IUD Mirena at postpartum visit  Newborn Data: Live born female  Birth Weight: 6 lb 10.5 oz (3019 g) APGAR: 8, 9  Baby Feeding: breast and bottle Disposition:home with mother   03/09/2017 Allie Bossier, MD

## 2017-03-09 NOTE — Discharge Instructions (Signed)

## 2017-12-21 IMAGING — US US MFM FETAL BPP W/O NON-STRESS
1 series · 13 of 28 positions shown · non-contrast
Comparison: none

[Series 1: us mfm fetal bpp w/o non-stress · 34 acquisitions, 13 frames shown]
[im 2/34]
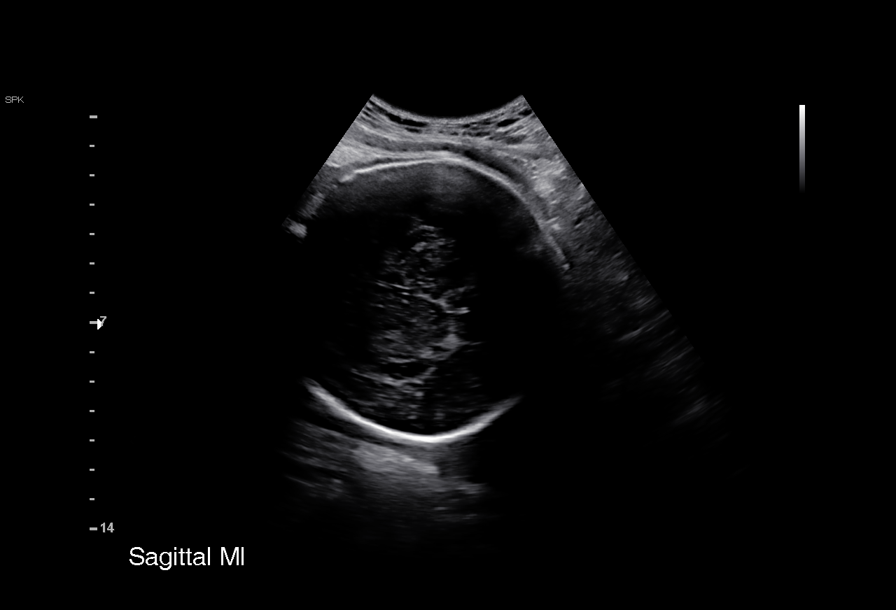
[im 4/34]
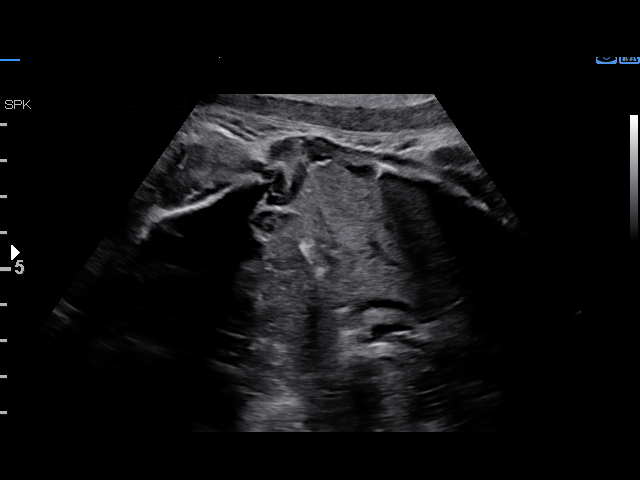
[im 7/34]
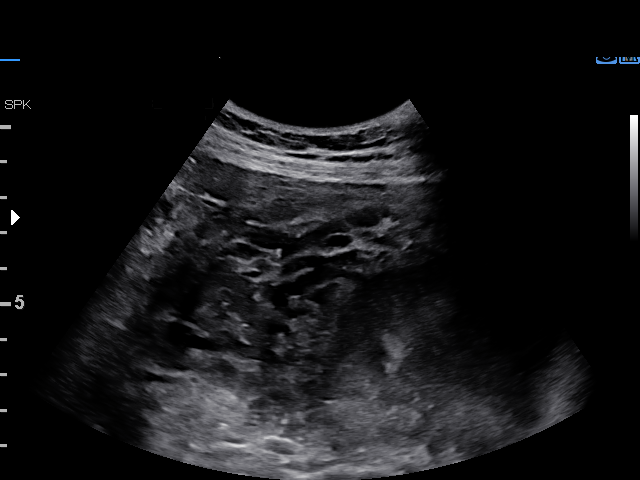
[im 9/34]
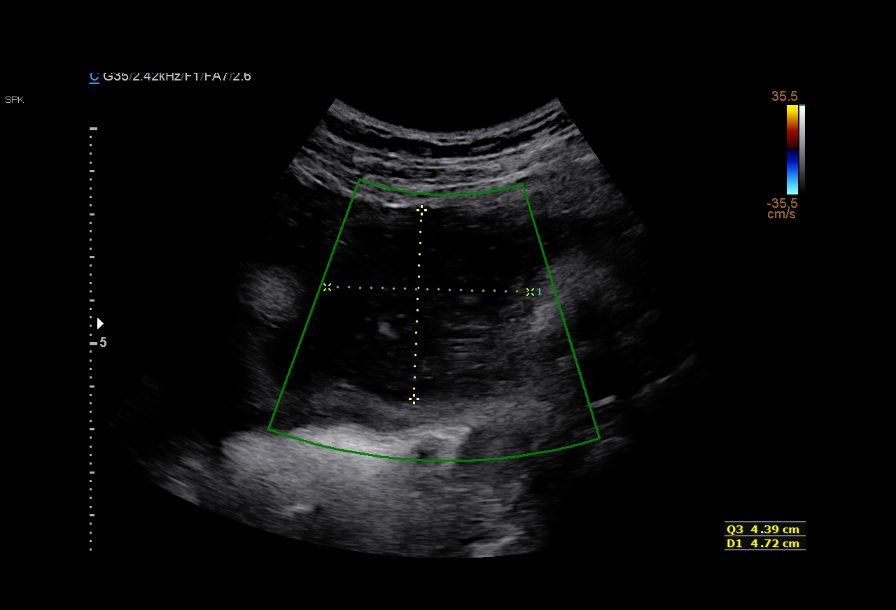
[im 12/34]
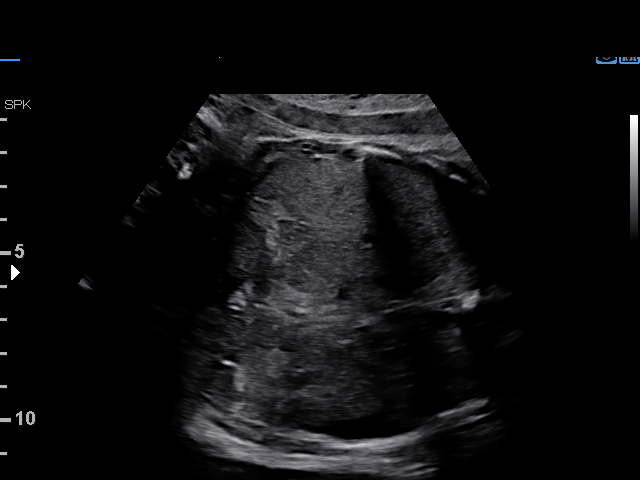
[im 14/34]
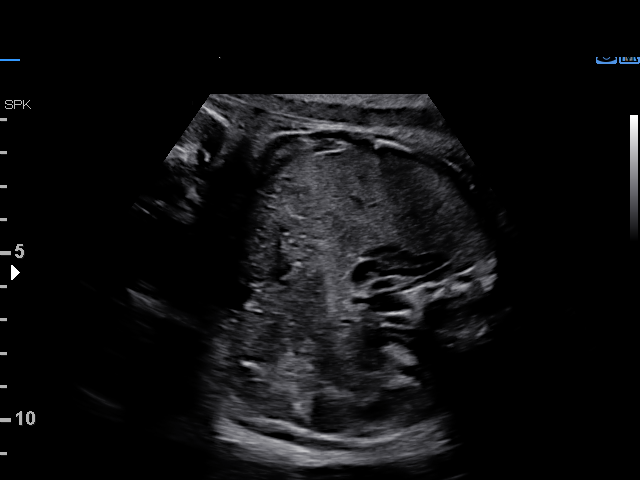
[im 18/34]
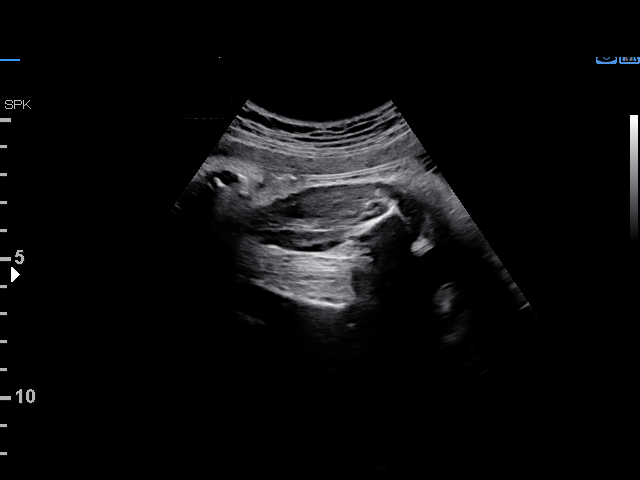
[im 20/34]
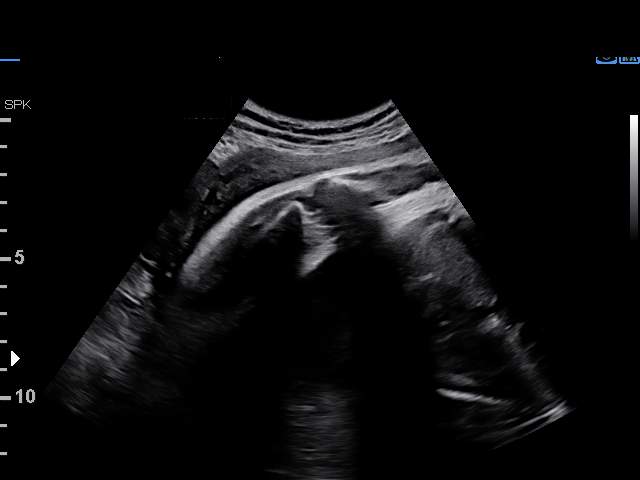
[im 23/34]
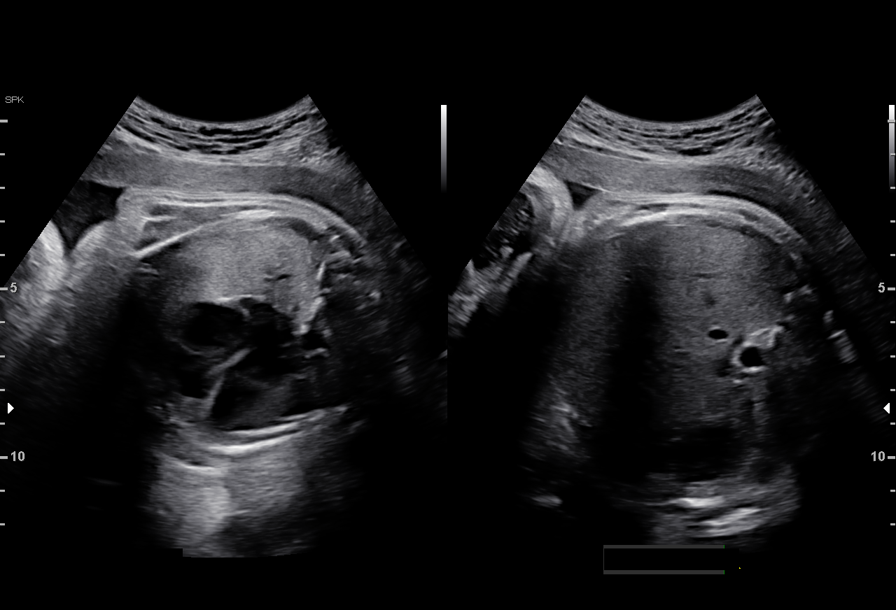
[im 25/34]
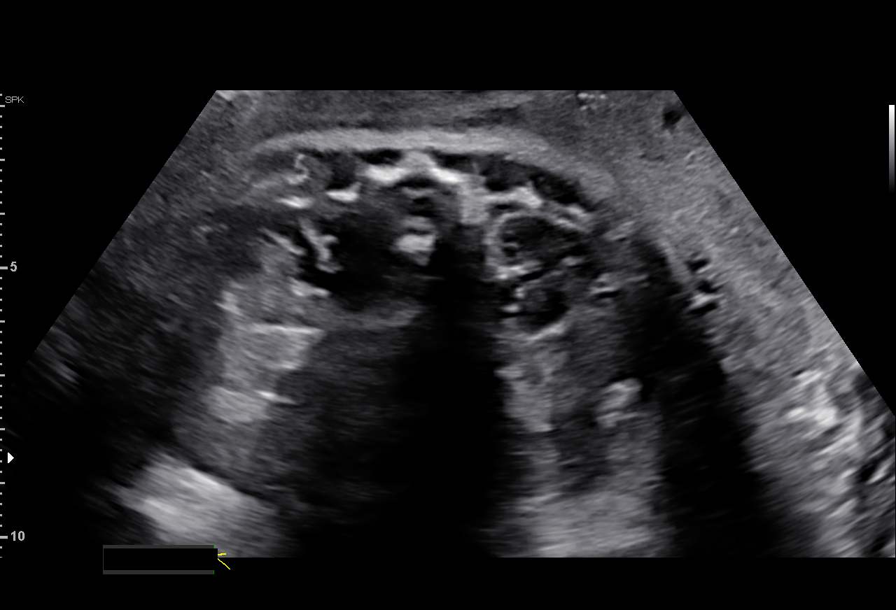
[im 27/34]
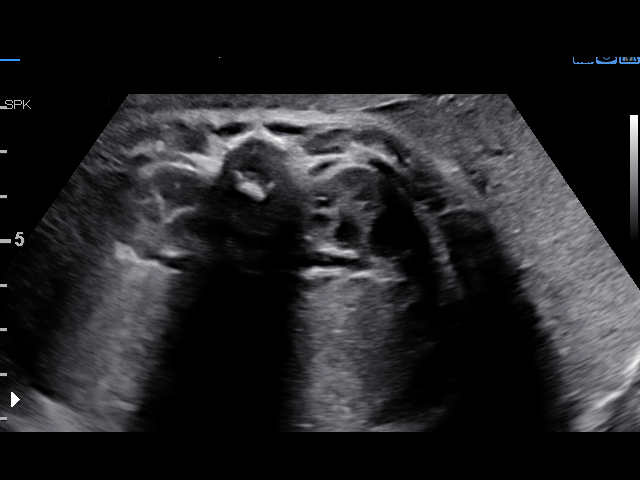
[im 30/34]
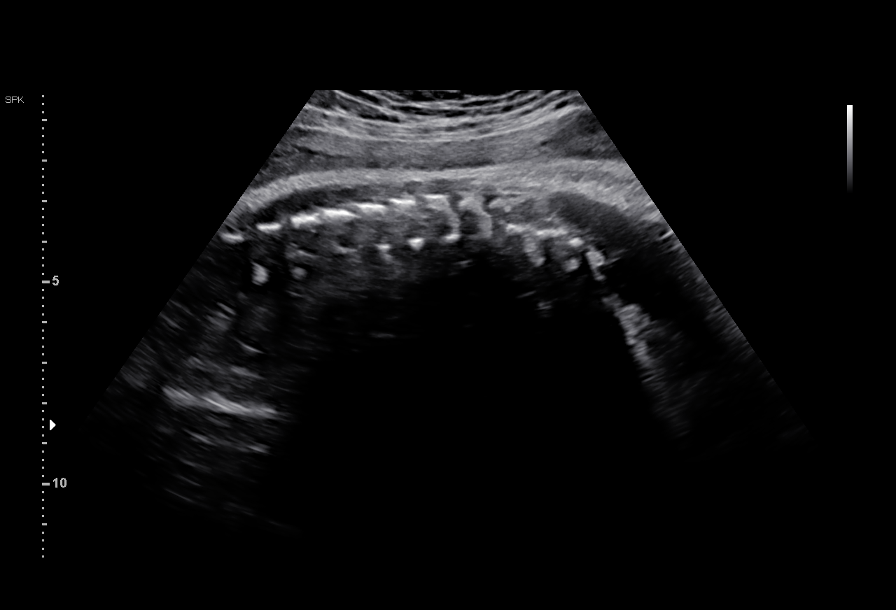
[im 32/34]
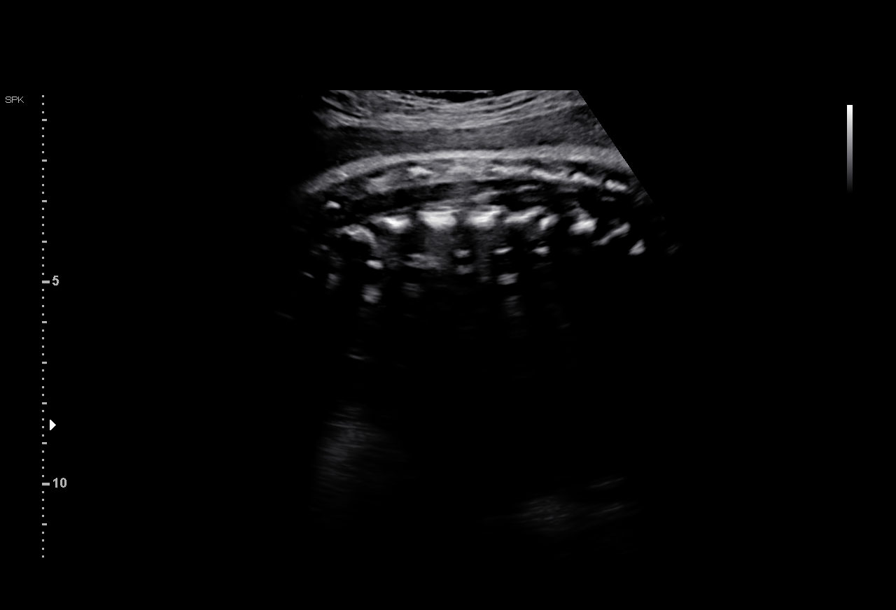

[13 of 28 positions shown; findings below may reference images not displayed]

pm)

BONGJU NP

1  DANSHIL ELISABETE            [id number]      [id number]     [id number]
Indications

39 weeks gestation of pregnancy
Uterine size-date discrepancy, third trimester
OB History

Blood Type:            Height:  5'0"   Weight (lb):  132       BMI:
Gravidity:    3         Term:   0        Prem:   0        SAB:   0
TOP:          0       Ectopic:  0        Living: 2
Fetal Evaluation

Num Of Fetuses:     1
Fetal Heart         123
Rate(bpm):
Cardiac Activity:   Observed
Presentation:       Cephalic
Placenta:           Posterior, above cervical os

Amniotic Fluid
AFI FV:      Subjectively within normal limits

AFI Sum(cm)     %Tile       Largest Pocket(cm)
7.87            11

RUQ(cm)       RLQ(cm)       LUQ(cm)        LLQ(cm)
0.46
Biophysical Evaluation

Amniotic F.V:   Within normal limits       F. Tone:        Observed
F. Movement:    Observed                   Score:          [DATE]
F. Breathing:   Observed
Gestational Age

LMP:           39w 1d        Date:  06/06/16                 EDD:   03/13/17
Best:          39w 1d     Det. By:  LMP  (06/06/16)          EDD:   03/13/17
Anatomy

Heart:                 Appears normal         Kidneys:                Appear normal
(4CH, axis, and situs
Stomach:               Appears normal, left   Bladder:                Appears normal
sided
Abdomen:               Appears normal
Impression

SIUP at 39+1 weeks
Cephalic presentation
Low normal amniotic fluid volume
BPP [DATE]
Recommendations

Follow-up as clinically indicated
